# Patient Record
Sex: Male | Born: 1975 | Race: Black or African American | Hispanic: No | Marital: Single | Smoking: Never smoker
Health system: Southern US, Community
[De-identification: ages and names within clinical notes are randomized; demographics above are authoritative.]

---

## 1999-06-16 ENCOUNTER — Encounter: Admission: RE | Admit: 1999-06-16 | Discharge: 1999-09-14 | Payer: Self-pay | Admitting: *Deleted

## 2003-06-05 ENCOUNTER — Emergency Department (HOSPITAL_COMMUNITY): Admission: EM | Admit: 2003-06-05 | Discharge: 2003-06-05 | Payer: Self-pay | Admitting: Family Medicine

## 2006-04-28 ENCOUNTER — Emergency Department (HOSPITAL_COMMUNITY): Admission: EM | Admit: 2006-04-28 | Discharge: 2006-04-28 | Payer: Self-pay | Admitting: Emergency Medicine

## 2006-11-03 ENCOUNTER — Emergency Department (HOSPITAL_COMMUNITY): Admission: EM | Admit: 2006-11-03 | Discharge: 2006-11-03 | Payer: Self-pay | Admitting: Family Medicine

## 2008-05-26 ENCOUNTER — Emergency Department (HOSPITAL_COMMUNITY): Admission: EM | Admit: 2008-05-26 | Discharge: 2008-05-27 | Payer: Self-pay | Admitting: Emergency Medicine

## 2008-08-13 ENCOUNTER — Emergency Department (HOSPITAL_COMMUNITY): Admission: EM | Admit: 2008-08-13 | Discharge: 2008-08-13 | Payer: Self-pay | Admitting: Emergency Medicine

## 2009-11-16 ENCOUNTER — Emergency Department (HOSPITAL_COMMUNITY): Admission: EM | Admit: 2009-11-16 | Discharge: 2009-11-16 | Payer: Self-pay | Admitting: Emergency Medicine

## 2010-07-20 LAB — POCT I-STAT, CHEM 8
Calcium, Ion: 1.18 mmol/L (ref 1.12–1.32)
Creatinine, Ser: 1.1 mg/dL (ref 0.4–1.5)
Glucose, Bld: 95 mg/dL (ref 70–99)
HCT: 46 % (ref 39.0–52.0)
Hemoglobin: 15.6 g/dL (ref 13.0–17.0)
TCO2: 27 mmol/L (ref 0–100)

## 2010-07-20 LAB — DIFFERENTIAL
Basophils Absolute: 0 10*3/uL (ref 0.0–0.1)
Basophils Relative: 0 % (ref 0–1)
Lymphocytes Relative: 20 % (ref 12–46)
Neutro Abs: 6.6 10*3/uL (ref 1.7–7.7)
Neutrophils Relative %: 70 % (ref 43–77)

## 2010-07-20 LAB — CBC
Hemoglobin: 14.6 g/dL (ref 13.0–17.0)
MCHC: 33.3 g/dL (ref 30.0–36.0)
RBC: 5.46 MIL/uL (ref 4.22–5.81)
RDW: 13.4 % (ref 11.5–15.5)

## 2010-07-20 LAB — POCT CARDIAC MARKERS
CKMB, poc: <1 ng/mL — ABNORMAL LOW (ref 1.0–8.0)
Myoglobin, poc: 41.4 ng/mL (ref 12–200)

## 2010-07-27 LAB — COMPREHENSIVE METABOLIC PANEL
AST: 24 U/L (ref 0–37)
Albumin: 4.1 g/dL (ref 3.5–5.2)
Calcium: 9.3 mg/dL (ref 8.4–10.5)
Chloride: 107 mEq/L (ref 96–112)
Creatinine, Ser: 0.94 mg/dL (ref 0.4–1.5)
GFR calc Af Amer: >60 mL/min (ref >60)
Sodium: 141 mEq/L (ref 135–145)
Total Bilirubin: 0.7 mg/dL (ref 0.3–1.2)

## 2010-07-27 LAB — URINALYSIS, ROUTINE W REFLEX MICROSCOPIC
Glucose, UA: NEGATIVE mg/dL
Specific Gravity, Urine: 1.034 — ABNORMAL HIGH (ref 1.005–1.030)
Urobilinogen, UA: 0.2 mg/dL (ref 0.0–1.0)
pH: 6.5 (ref 5.0–8.0)

## 2010-07-27 LAB — URINE MICROSCOPIC-ADD ON

## 2010-07-27 LAB — DIFFERENTIAL
Eosinophils Relative: 2 % (ref 0–5)
Lymphocytes Relative: 23 % (ref 12–46)
Lymphs Abs: 2.3 10*3/uL (ref 0.7–4.0)
Monocytes Absolute: 0.8 10*3/uL (ref 0.1–1.0)

## 2010-07-27 LAB — CBC
MCV: 81.2 fL (ref 78.0–100.0)
Platelets: 269 10*3/uL (ref 150–400)
WBC: 10 10*3/uL (ref 4.0–10.5)

## 2010-07-27 LAB — POCT CARDIAC MARKERS: Troponin i, poc: <0.05 ng/mL (ref 0.00–0.09)

## 2011-12-19 ENCOUNTER — Encounter (HOSPITAL_COMMUNITY): Payer: Self-pay | Admitting: *Deleted

## 2011-12-19 DIAGNOSIS — Z87898 Personal history of other specified conditions: Secondary | ICD-10-CM | POA: Insufficient documentation

## 2011-12-19 DIAGNOSIS — M545 Low back pain, unspecified: Secondary | ICD-10-CM | POA: Insufficient documentation

## 2011-12-19 DIAGNOSIS — R1032 Left lower quadrant pain: Secondary | ICD-10-CM | POA: Insufficient documentation

## 2011-12-19 LAB — CBC WITH DIFFERENTIAL/PLATELET
Basophils Relative: 0 % (ref 0–1)
Hemoglobin: 15.1 g/dL (ref 13.0–17.0)
Lymphs Abs: 2.4 10*3/uL (ref 0.7–4.0)
Monocytes Relative: 9 % (ref 3–12)
Neutro Abs: 6.2 10*3/uL (ref 1.7–7.7)
Neutrophils Relative %: 64 % (ref 43–77)
RBC: 5.71 MIL/uL (ref 4.22–5.81)

## 2011-12-19 LAB — URINALYSIS, ROUTINE W REFLEX MICROSCOPIC
Leukocytes, UA: NEGATIVE
Nitrite: NEGATIVE
Specific Gravity, Urine: 1.024 (ref 1.005–1.030)
pH: 7 (ref 5.0–8.0)

## 2011-12-19 LAB — COMPREHENSIVE METABOLIC PANEL
Albumin: 4 g/dL (ref 3.5–5.2)
Alkaline Phosphatase: 54 U/L (ref 39–117)
BUN: 16 mg/dL (ref 6–23)
Chloride: 101 mEq/L (ref 96–112)
Glucose, Bld: 90 mg/dL (ref 70–99)
Potassium: 3.9 mEq/L (ref 3.5–5.1)
Total Bilirubin: 0.4 mg/dL (ref 0.3–1.2)

## 2011-12-19 NOTE — ED Notes (Signed)
C/o lt flank pain for 2 hours no previous history.  Nausea vomited once..  He has taken a ibu.

## 2011-12-20 ENCOUNTER — Emergency Department (HOSPITAL_COMMUNITY): Payer: Self-pay

## 2011-12-20 ENCOUNTER — Emergency Department (HOSPITAL_COMMUNITY)
Admission: EM | Admit: 2011-12-20 | Discharge: 2011-12-20 | Disposition: A | Discharge status: Home / Self Care | Payer: Self-pay | Attending: Emergency Medicine | Admitting: Emergency Medicine

## 2011-12-20 ENCOUNTER — Encounter (HOSPITAL_COMMUNITY): Payer: Self-pay | Admitting: Radiology

## 2011-12-20 DIAGNOSIS — R1032 Left lower quadrant pain: Secondary | ICD-10-CM

## 2011-12-20 MED ORDER — MELOXICAM 7.5 MG PO TABS: 15.0000 mg | ORAL_TABLET | Freq: Every day | ORAL | Status: DC

## 2011-12-20 MED ORDER — HYDROMORPHONE HCL PF 1 MG/ML IJ SOLN
1.0000 mg | Freq: Once | INTRAMUSCULAR | Status: AC
  Administered 2011-12-20: 1 mg via INTRAVENOUS
  Filled 2011-12-20: qty 1

## 2011-12-20 MED ORDER — ONDANSETRON HCL 4 MG/2ML IJ SOLN
4.0000 mg | Freq: Once | INTRAMUSCULAR | Status: AC
  Administered 2011-12-20: 4 mg via INTRAVENOUS
  Filled 2011-12-20: qty 2

## 2011-12-20 MED ORDER — SODIUM CHLORIDE 0.9 % IV SOLN
Freq: Once | INTRAVENOUS | Status: AC
  Administered 2011-12-20: 02:00:00 via INTRAVENOUS

## 2011-12-20 MED ORDER — IOHEXOL 300 MG/ML  SOLN: 20.0000 mL | INTRAMUSCULAR | Status: AC

## 2011-12-20 MED ORDER — IOHEXOL 300 MG/ML  SOLN
100.0000 mL | Freq: Once | INTRAMUSCULAR | Status: AC | PRN
  Administered 2011-12-20: 100 mL via INTRAVENOUS

## 2011-12-20 NOTE — ED Provider Notes (Signed)
Medical screening examination/treatment/procedure(s) were performed by non-physician practitioner and as supervising physician I was immediately available for consultation/collaboration.   Navdeep Fessenden, MD 12/20/11 2255 

## 2011-12-20 NOTE — ED Provider Notes (Signed)
History     CSN: [id number]  Arrival date & time 12/19/11  4098   First MD Initiated Contact with Patient 12/20/11 0033      Chief Complaint  Patient presents with  . Back Pain    (Consider location/radiation/quality/duration/timing/severity/associated sxs/prior treatment) HPI Comments: Patient complaining of left side.  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  Her for syndrome has never had a bowel obstruction or an ileus.  He, states his pain started about 5:00 yesterday, afternoon, it's been constant, associated with nausea.  He does not report any diarrhea, or vomiting.  He states he is not passing gas.  He is had no appetite, although he did force himself to eat a small amount at dinner, which did not cause any increase in his pain, nausea or vomiting  Patient is a 36 y.o. male presenting with back pain. The history is provided by the patient.  Back Pain  This is a new problem. The problem occurs constantly. The problem has not changed since onset.The pain is associated with no known injury. The quality of the pain is described as aching. Pertinent negatives include no fever, no headaches, no abdominal pain, no dysuria and no weakness.    History reviewed. No pertinent past medical history.  History reviewed. No pertinent past surgical history.  No family history on file.  History  Substance Use Topics  . Smoking status: Never Smoker   . Smokeless tobacco: Not on file  . Alcohol Use: No      Review of Systems  Constitutional: Negative for fever and chills.  HENT: Negative for congestion.   Respiratory: Negative for cough and shortness of breath.   Gastrointestinal: Positive for nausea and vomiting. Negative for abdominal pain, diarrhea and constipation.  Genitourinary: Positive for flank pain. Negative for dysuria, hematuria and decreased urine volume.  Musculoskeletal: Positive for back pain.    Neurological: Negative for dizziness, weakness and headaches.    Allergies  Review of patient's allergies indicates no known allergies.  Home Medications   Current Outpatient Rx  Name Route Sig Dispense Refill  . IBUPROFEN 800 MG PO TABS Oral Take 800 mg by mouth once.    . MELOXICAM 7.5 MG PO TABS Oral Take 2 tablets (15 mg total) by mouth daily. 20 tablet 0    BP 120/75  Pulse 60  Temp 98.1 F (36.7 C) (Oral)  Resp 20  SpO2 100%  Physical Exam  Constitutional: He appears well-developed and well-nourished.  HENT:  Head: Normocephalic.  Eyes: Pupils are equal, round, and reactive to light.  Neck: Normal range of motion.  Cardiovascular: Normal rate.   Pulmonary/Chest: Effort normal.  Abdominal: Soft. He exhibits no distension. Bowel sounds are decreased. There is no tenderness.      ED Course  Procedures (including critical care time)   Labs Reviewed  URINALYSIS, ROUTINE W REFLEX MICROSCOPIC  CBC WITH DIFFERENTIAL  COMPREHENSIVE METABOLIC PANEL  LAB REPORT - SCANNED   No results found.   1. Abdominal pain, LLQ       MDM          Arman Filter, NP 12/23/11 [phone]

## 2011-12-20 NOTE — ED Provider Notes (Signed)
  Physical Exam  BP 129/83  Pulse 60  Temp 98.7 F (37.1 C) (Oral)  Resp 18  SpO2 100%  6:19 AM Assumed care of the patient from Philis Kendall. Patient with distant history of Hirschsprung's disease, colostomy, and repair, complaint of intermittent left lower quadrant pain. Plain films of the abdomen showed no obstruction. Labs have been within normal limits. Awaiting. CT results of the abdomen. Physical Exam 9:12 AM BP 129/83  Pulse 60  Temp 98.7 F (37.1 C) (Oral)  Resp 18  SpO2 100% CV: RRR, No M/R/G, Peripheral pulses intact. No peripheral edema. Lungs: CTAB Abd: Soft, non distended. Tenderness to palpation of the iliopsoas muscle on the left side.   Pain is reproduced when pressure is applied to the psoas muscle and active hip flexion is initiated. I believe the patient's pain is likely related to spasm in this tissue and would account for both his left lower quadrant and low back pain on the left. Patient is also under extreme stress. He is undergoing relationship breakup breakup and has assumed care of his 37-year-old. I believe he is also suffering from depression and anxiety. I am treating the patient with meloxicam for muscle spasm. I have also suggested counseling. Return precautions discussed. All questions answered fully.  ED Course  Procedures  MDM Discussed reasons to seek immediate care. Patient expresses understanding and agrees with plan.'       Arthor Captain, PA-C 12/20/11 1610

## 2011-12-24 NOTE — ED Provider Notes (Signed)
Medical screening examination/treatment/procedure(s) were performed by non-physician practitioner and as supervising physician I was immediately available for consultation/collaboration.  Treyson Axel, MD 12/24/11 0041 

## 2012-05-22 ENCOUNTER — Emergency Department (HOSPITAL_COMMUNITY)
Admission: EM | Admit: 2012-05-22 | Discharge: 2012-05-22 | Disposition: A | Discharge status: Home / Self Care | Payer: Self-pay | Attending: Emergency Medicine | Admitting: Emergency Medicine

## 2012-05-22 ENCOUNTER — Emergency Department (HOSPITAL_COMMUNITY): Payer: Self-pay

## 2012-05-22 ENCOUNTER — Encounter (HOSPITAL_COMMUNITY): Payer: Self-pay | Admitting: Physical Medicine and Rehabilitation

## 2012-05-22 DIAGNOSIS — F439 Reaction to severe stress, unspecified: Secondary | ICD-10-CM

## 2012-05-22 DIAGNOSIS — Z79899 Other long term (current) drug therapy: Secondary | ICD-10-CM | POA: Insufficient documentation

## 2012-05-22 DIAGNOSIS — R079 Chest pain, unspecified: Secondary | ICD-10-CM

## 2012-05-22 DIAGNOSIS — F430 Acute stress reaction: Secondary | ICD-10-CM | POA: Insufficient documentation

## 2012-05-22 DIAGNOSIS — R0602 Shortness of breath: Secondary | ICD-10-CM | POA: Insufficient documentation

## 2012-05-22 LAB — BASIC METABOLIC PANEL WITH GFR
BUN: 13 mg/dL (ref 6–23)
CO2: 24 meq/L (ref 19–32)
Calcium: 9.2 mg/dL (ref 8.4–10.5)
Chloride: 104 meq/L (ref 96–112)
Creatinine, Ser: 0.81 mg/dL (ref 0.50–1.35)
GFR calc Af Amer: >90 mL/min
GFR calc non Af Amer: >90 mL/min
Glucose, Bld: 94 mg/dL (ref 70–99)
Potassium: 4.2 meq/L (ref 3.5–5.1)
Sodium: 138 meq/L (ref 135–145)

## 2012-05-22 LAB — CBC WITH DIFFERENTIAL/PLATELET
Basophils Relative: 0 % (ref 0–1)
Eosinophils Absolute: 0.1 10*3/uL (ref 0.0–0.7)
Eosinophils Relative: 1 % (ref 0–5)
HCT: 45.6 % (ref 39.0–52.0)
Hemoglobin: 15 g/dL (ref 13.0–17.0)
Lymphs Abs: 2.3 10*3/uL (ref 0.7–4.0)
MCH: 26.2 pg (ref 26.0–34.0)
MCHC: 32.9 g/dL (ref 30.0–36.0)
MCV: 79.7 fL (ref 78.0–100.0)
Monocytes Absolute: 0.7 10*3/uL (ref 0.1–1.0)
Monocytes Relative: 9 % (ref 3–12)
RBC: 5.72 MIL/uL (ref 4.22–5.81)

## 2012-05-22 LAB — POCT I-STAT TROPONIN I: Troponin i, poc: 0 ng/mL (ref 0.00–0.08)

## 2012-05-22 LAB — TROPONIN I: Troponin I: <0.3 ng/mL (ref <0.30)

## 2012-05-22 NOTE — ED Provider Notes (Signed)
History     CSN: [id number]  Arrival date & time 05/22/12  1413   First MD Initiated Contact with Patient 05/22/12 1522      Chief Complaint  Patient presents with  . Chest Pain  . Shortness of Breath    (Consider location/radiation/quality/duration/timing/severity/associated sxs/prior treatment) HPI Comments: 37 year old male with no significant past medical history presents emergency department complaining of left-sided chest pain with onset 4 hours ago lasting four minutes this afternoon while at work. Describes the pain as a nonradiating "bubble". Admits to associated shortness of breath at that time. Currently he does not have any chest pain. States when the pain came on, he sat down and took a deep breath which helped the pain go away. Admits to being under a lot of increased stress in his daily life due to issues in his relationship. Denies nausea, vomiting, diaphoresis or any other symptoms. States he's had similar chest pain in the past on and off for the past 10 years, however no specific diagnosis has been found. He is a nonsmoker. No family history of early heart disease. No recent long car or plane rides.  Patient is a 37 y.o. male presenting with chest pain and shortness of breath. The history is provided by the patient.  Chest Pain Associated symptoms: shortness of breath   Associated symptoms: no back pain, no cough, no diaphoresis, no nausea, no numbness, no palpitations and not vomiting   Shortness of Breath Associated symptoms: chest pain   Associated symptoms: no cough, no diaphoresis, no neck pain, no vomiting and no wheezing     No past medical history on file.  No past surgical history on file.  History reviewed. No pertinent family history.  History  Substance Use Topics  . Smoking status: Never Smoker   . Smokeless tobacco: Not on file  . Alcohol Use: No      Review of Systems  Constitutional: Negative for diaphoresis.  HENT: Negative for neck pain  and neck stiffness.   Respiratory: Positive for shortness of breath. Negative for cough and wheezing.   Cardiovascular: Positive for chest pain. Negative for palpitations and leg swelling.  Gastrointestinal: Negative for nausea and vomiting.  Musculoskeletal: Negative for back pain.  Neurological: Negative for syncope and numbness.  Psychiatric/Behavioral: Negative for confusion.       Positive for increased stress.  All other systems reviewed and are negative.    Allergies  Review of patient's allergies indicates no known allergies.  Home Medications   Current Outpatient Rx  Name  Route  Sig  Dispense  Refill  . ibuprofen (ADVIL,MOTRIN) 800 MG tablet   Oral   Take 800 mg by mouth once.         . meloxicam (MOBIC) 7.5 MG tablet   Oral   Take 2 tablets (15 mg total) by mouth daily.   20 tablet   0     BP 138/76  Pulse 74  Temp(Src) 98.2 F (36.8 C) (Oral)  Resp 16  SpO2 98%  Physical Exam  Nursing note and vitals reviewed. Constitutional: He is oriented to person, place, and time. He appears well-developed and well-nourished. No distress.  HENT:  Head: Normocephalic and atraumatic.  Mouth/Throat: Oropharynx is clear and moist.  Eyes: Conjunctivae and EOM are normal. Pupils are equal, round, and reactive to light.  Neck: Normal range of motion. Neck supple. No JVD present.  Cardiovascular: Normal rate, regular rhythm, normal heart sounds and intact distal pulses.   No extremity  edema.  Pulmonary/Chest: Effort normal and breath sounds normal. No respiratory distress.  Abdominal: Soft. Bowel sounds are normal. There is no tenderness.  Musculoskeletal: Normal range of motion. He exhibits no edema.  Neurological: He is alert and oriented to person, place, and time.  Skin: Skin is warm and dry.  Psychiatric: He has a normal mood and affect. His behavior is normal.    ED Course  Procedures (including critical care time)  Labs Reviewed  CBC WITH DIFFERENTIAL   BASIC METABOLIC PANEL  TROPONIN I  POCT I-STAT TROPONIN I   Date: 05/22/2012  Rate: 66  Rhythm: normal sinus rhythm and sinus arrhythmia  QRS Axis: normal  Intervals: normal  ST/T Wave abnormalities: nonspecific ST changes  Conduction Disutrbances:none  Narrative Interpretation: early repolarization  Old EKG Reviewed: none available   Dg Chest 2 View  05/22/2012  *RADIOLOGY REPORT*  Clinical Data: Chest pain and shortness of breath.  CHEST - 2 VIEW  Comparison: Two-view chest 08/13/2008.  Findings: Heart size is normal.  The lungs are clear.  The visualized soft tissues and bony thorax are unremarkable.  IMPRESSION: Negative two-view chest.   Original Report Authenticated By: Marin Roberts, M.D.      1. Stress   2. Chest pain       MDM  37 year old male with chest pain lasting about 4 minutes 4 hours prior to arrival. Currently he is in no apparent distress. Admits to being under a lot of increased stress in life and with his relationship. All of his labs, EKG and chest x-ray were normal. Initial troponin negative. Three-hour troponin obtained which was also negative. He is stable for discharge. He will followup with a primary care physician on the resource list. Case discussed with Dr. Silverio Lay who agrees with plan of care. Return precautions discussed. Patient states understanding of plan and is agreeable.        Trevor Mace, PA-C 05/22/12 1925

## 2012-05-22 NOTE — ED Notes (Signed)
Pt presents to department for evaluation of midsternal non radiating chest pain. Onset this afternoon while at work. Pt states he has been dealing with same issue x10 years. Also states SOB. Denies chest pain at the time. States he has been under severe stress lately. He is alert and oriented x4. Skin warm and dry. Respirations unlabored.

## 2012-05-22 NOTE — ED Notes (Addendum)
Pt presents for evaluation of intermittent chest pain that he has had over the past 2 years, denies any radiation of pain.  Pt was at work and states "it felt like an air bubble went through my heart and I was concerned", also states he was told in the emergency department that he had inflammation in the lining of his heart.  Denies seeing a cardiologist for symptoms.  Pt resting and denies any pain at present.  Pt states he has been under a lot of stress lately.

## 2012-05-23 NOTE — ED Provider Notes (Signed)
Medical screening examination/treatment/procedure(s) were performed by non-physician practitioner and as supervising physician I was immediately available for consultation/collaboration.   Richardean Canal, MD 05/23/12 2139

## 2016-02-08 ENCOUNTER — Emergency Department (HOSPITAL_COMMUNITY): Payer: Medicaid Other

## 2016-02-08 ENCOUNTER — Emergency Department (HOSPITAL_COMMUNITY)
Admission: EM | Admit: 2016-02-08 | Discharge: 2016-02-08 | Disposition: A | Discharge status: Home / Self Care | Payer: Medicaid Other | Attending: Emergency Medicine | Admitting: Emergency Medicine

## 2016-02-08 DIAGNOSIS — R0789 Other chest pain: Secondary | ICD-10-CM | POA: Insufficient documentation

## 2016-02-08 DIAGNOSIS — Z7982 Long term (current) use of aspirin: Secondary | ICD-10-CM | POA: Insufficient documentation

## 2016-02-08 DIAGNOSIS — Z79899 Other long term (current) drug therapy: Secondary | ICD-10-CM | POA: Insufficient documentation

## 2016-02-08 DIAGNOSIS — R079 Chest pain, unspecified: Secondary | ICD-10-CM | POA: Diagnosis present

## 2016-02-08 LAB — COMPREHENSIVE METABOLIC PANEL
ALK PHOS: 51 U/L (ref 38–126)
ALT: 31 U/L (ref 17–63)
ANION GAP: 6 (ref 5–15)
AST: 26 U/L (ref 15–41)
Albumin: 4 g/dL (ref 3.5–5.0)
BUN: 11 mg/dL (ref 6–20)
CALCIUM: 9 mg/dL (ref 8.9–10.3)
CO2: 24 mmol/L (ref 22–32)
Chloride: 108 mmol/L (ref 101–111)
Creatinine, Ser: 0.85 mg/dL (ref 0.61–1.24)
Glucose, Bld: 91 mg/dL (ref 65–99)
Potassium: 4.4 mmol/L (ref 3.5–5.1)
SODIUM: 138 mmol/L (ref 135–145)
TOTAL PROTEIN: 7.2 g/dL (ref 6.5–8.1)
Total Bilirubin: 0.5 mg/dL (ref 0.3–1.2)

## 2016-02-08 LAB — CBC
HCT: 46.1 % (ref 39.0–52.0)
HEMOGLOBIN: 15.3 g/dL (ref 13.0–17.0)
MCH: 26.5 pg (ref 26.0–34.0)
MCHC: 33.2 g/dL (ref 30.0–36.0)
MCV: 79.9 fL (ref 78.0–100.0)
Platelets: 249 10*3/uL (ref 150–400)
RBC: 5.77 MIL/uL (ref 4.22–5.81)
RDW: 13 % (ref 11.5–15.5)
WBC: 9.8 10*3/uL (ref 4.0–10.5)

## 2016-02-08 LAB — TROPONIN I

## 2016-02-08 LAB — PROTIME-INR
INR: 0.96
PROTHROMBIN TIME: 12.8 s (ref 11.4–15.2)

## 2016-02-08 NOTE — ED Provider Notes (Signed)
[facility]-EMERGENCY DEPT Provider Note   CSN: [id number] Arrival date & time: 02/08/16  1348     History   Chief Complaint Chief Complaint  Patient presents with  . Chest Pain    HPI Keith Cook is a 40 y.o. male.  Pt presents to the ED today with CP.  Pt said that he has a hx of left sided chest pain due to "inflammation."  The pt said that he controlled his sx with nutritional supplements, but has not been taking them lately.  He said that he's not been eating a healthy diet either.  The pt said he ate an entire pie last night.  Pt said he took an asa by EMS and that has helped his sx.      No past medical history on file.  There are no active problems to display for this patient.   No past surgical history on file.     Home Medications    Prior to Admission medications   Medication Sig Start Date End Date Taking? Authorizing Provider  aspirin 81 MG chewable tablet Chew 324 mg by mouth daily as needed for mild pain.   Yes Historical Provider, MD  Misc Natural Products (DETOX PO) Take 1 tablet by mouth daily as needed (fluid).   Yes Historical Provider, MD    Family History No family history on file.  Social History Social History  Substance Use Topics  . Smoking status: Never Smoker  . Smokeless tobacco: Not on file  . Alcohol use No     Allergies   Review of patient's allergies indicates no known allergies.   Review of Systems Review of Systems  Cardiovascular: Positive for chest pain.  All other systems reviewed and are negative.    Physical Exam Updated Vital Signs BP 132/91   Pulse (!) 56   Temp 98.8 F (37.1 C) (Oral)   Resp 15   Ht 6\' 1"  (1.854 m)   Wt 185 lb (83.9 kg)   SpO2 93%   BMI 24.41 kg/m   Physical Exam  Constitutional: He is oriented to person, place, and time. He appears well-developed and well-nourished.  HENT:  Head: Normocephalic and atraumatic.  Right Ear: External ear normal.  Left Ear: External ear normal.    Nose: Nose normal.  Mouth/Throat: Oropharynx is clear and moist.  Eyes: Conjunctivae and EOM are normal. Pupils are equal, round, and reactive to light.  Neck: Normal range of motion. Neck supple.  Cardiovascular: Regular rhythm, normal heart sounds and intact distal pulses.  Bradycardia present.   Pulmonary/Chest: Effort normal and breath sounds normal.  Abdominal: Soft. Bowel sounds are normal.  Musculoskeletal: Normal range of motion.  Neurological: He is alert and oriented to person, place, and time.  Skin: Skin is warm.  Psychiatric: He has a normal mood and affect. His behavior is normal. Judgment and thought content normal.  Nursing note and vitals reviewed.    ED Treatments / Results  Labs (all labs ordered are listed, but only abnormal results are displayed) Labs Reviewed  CBC  COMPREHENSIVE METABOLIC PANEL  PROTIME-INR  TROPONIN I  URINALYSIS, ROUTINE W REFLEX MICROSCOPIC (NOT [facility])    EKG  EKG Interpretation  Date/Time:  Monday February 08 2016 14:09:32 EDT Ventricular Rate:  54 PR Interval:    QRS Duration: 86 QT Interval:  385 QTC Calculation: 365 R Axis:   58 Text Interpretation:  Sinus rhythm Consider left ventricular hypertrophy ST elev, probable normal early repol pattern No  significant change since last tracing Confirmed by [facility] MD, Keymarion Bearman [phone]([id number]) on 02/08/2016 3:09:57 PM       Radiology Dg Chest 2 View  Result Date: 02/08/2016 CLINICAL DATA:  Acute onset chest pain today for approximately 1 hour. EXAM: CHEST  2 VIEW COMPARISON:  05/22/2012 FINDINGS: The heart size and mediastinal contours are within normal limits. Both lungs are clear. No evidence of pleural effusion or pneumothorax. The visualized skeletal structures are unremarkable. IMPRESSION: Negative.  No active cardiopulmonary disease. Electronically Signed   By: Myles RosenthalJohn  Stahl M.D.   On: 02/08/2016 15:21    Procedures Procedures (including critical care time)  Medications Ordered in  ED Medications - No data to display   Initial Impression / Assessment and Plan / ED Course  I have reviewed the triage vital signs and the nursing notes.  Pertinent labs & imaging results that were available during my care of the patient were reviewed by me and considered in my medical decision making (see chart for details).  Clinical Course   Pt looks good.  Low risk for CAD.  Nl trop and CXR.  Pt knows to try to eat healthier and to exercise.  He knows to return if worse.    Final Clinical Impressions(s) / ED Diagnoses   Final diagnoses:  Atypical chest pain    New Prescriptions New Prescriptions   No medications on file     Jacalyn LefevreJulie Vernel Langenderfer, MD 02/08/16 1600

## 2016-02-08 NOTE — ED Triage Notes (Signed)
[facility] with [facility]/o chest pain that lasted 45 mins to an hour. 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 Patient ate a whole pie and states he hasn't been taking all his usually nutritional supplements. Patient states the sugary foods usually bring on the chest pain. Patient received 324 mg aspirin en route. EMS IV access obtained, 20 in left hand. EMS v/s 133/97, 98%, 70 HR, 18 RR. No c/o chest pain currently.

## 2017-06-30 IMAGING — CR DG CHEST 2V
2 series · 2 of 2 positions shown · non-contrast
Comparison: 05/22/2012

CLINICAL DATA: Acute onset chest pain today for approximately 1
hour.

EXAM:
CHEST  2 VIEW

[chest pa]
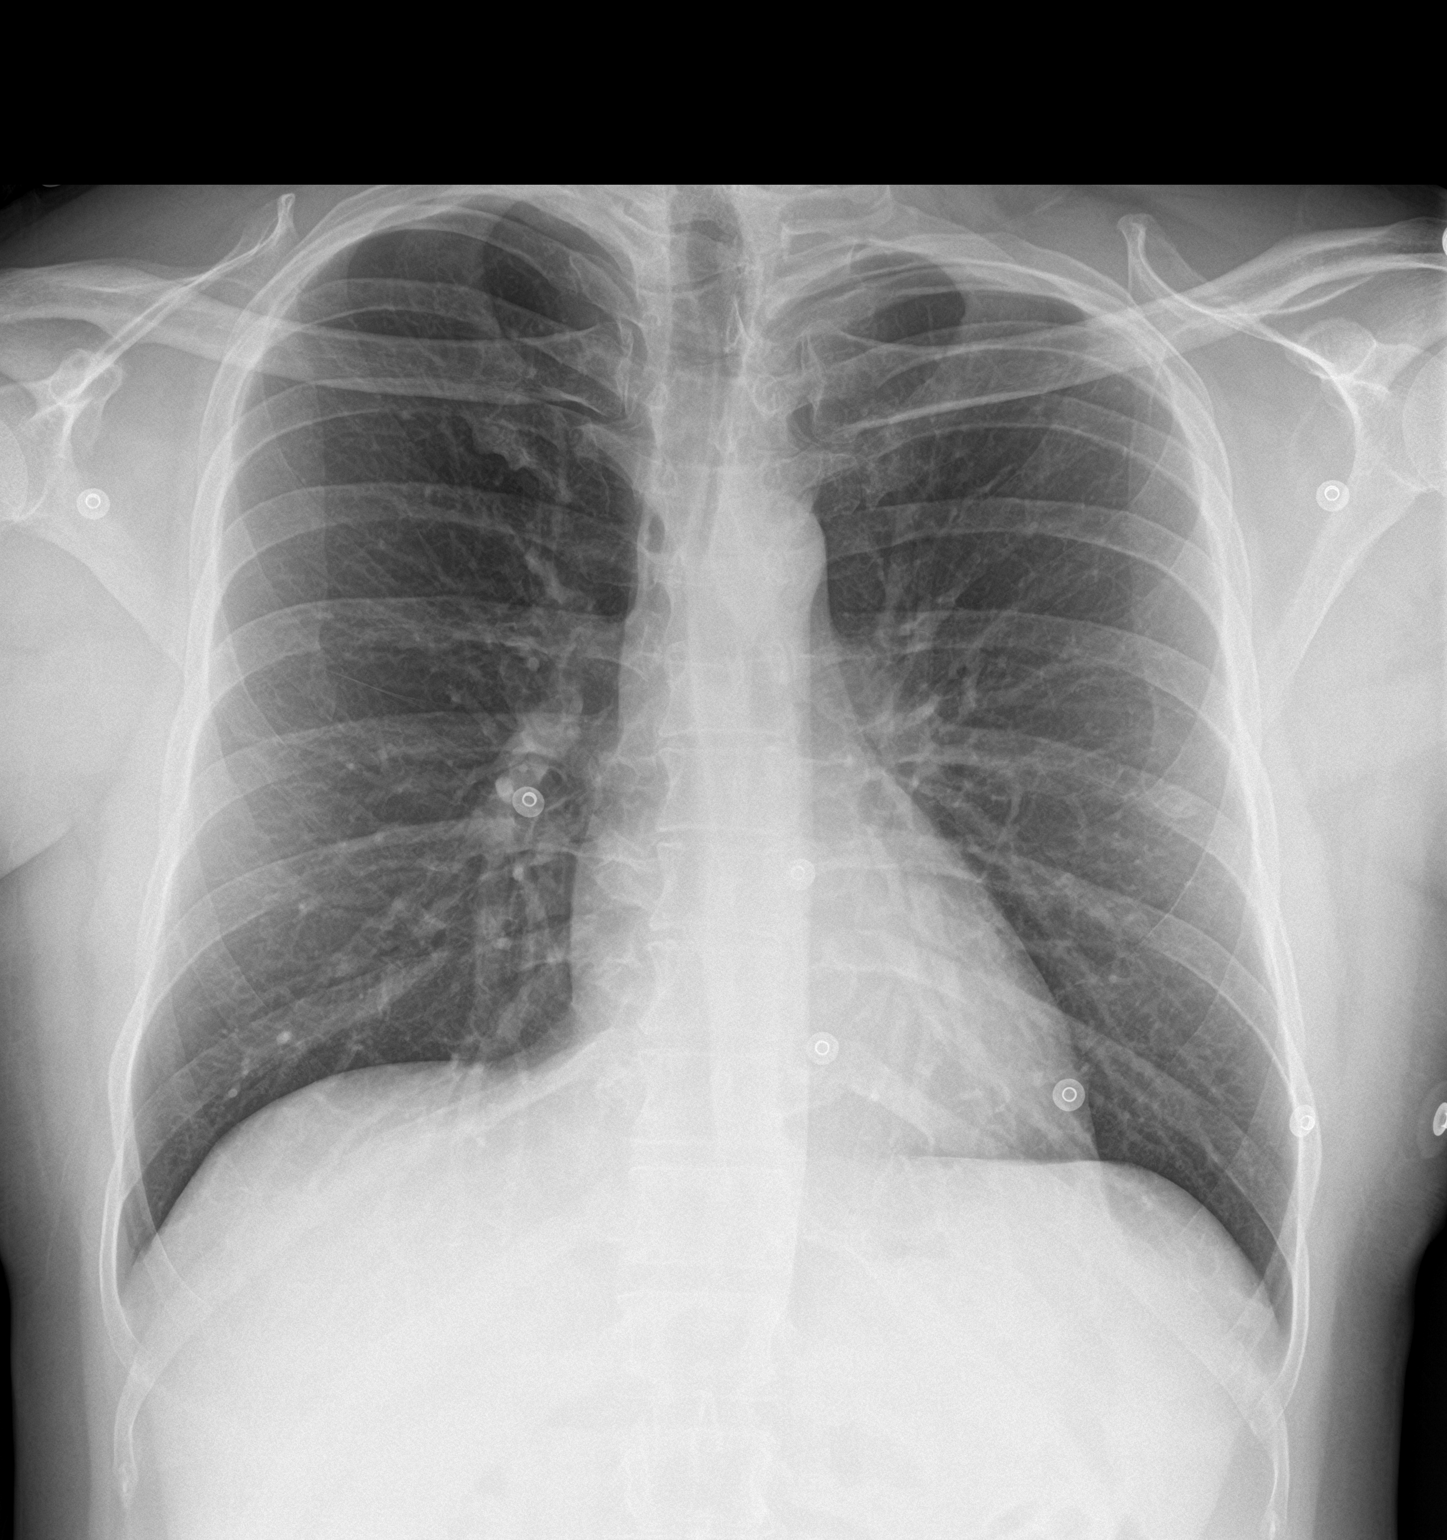

[chest lat]
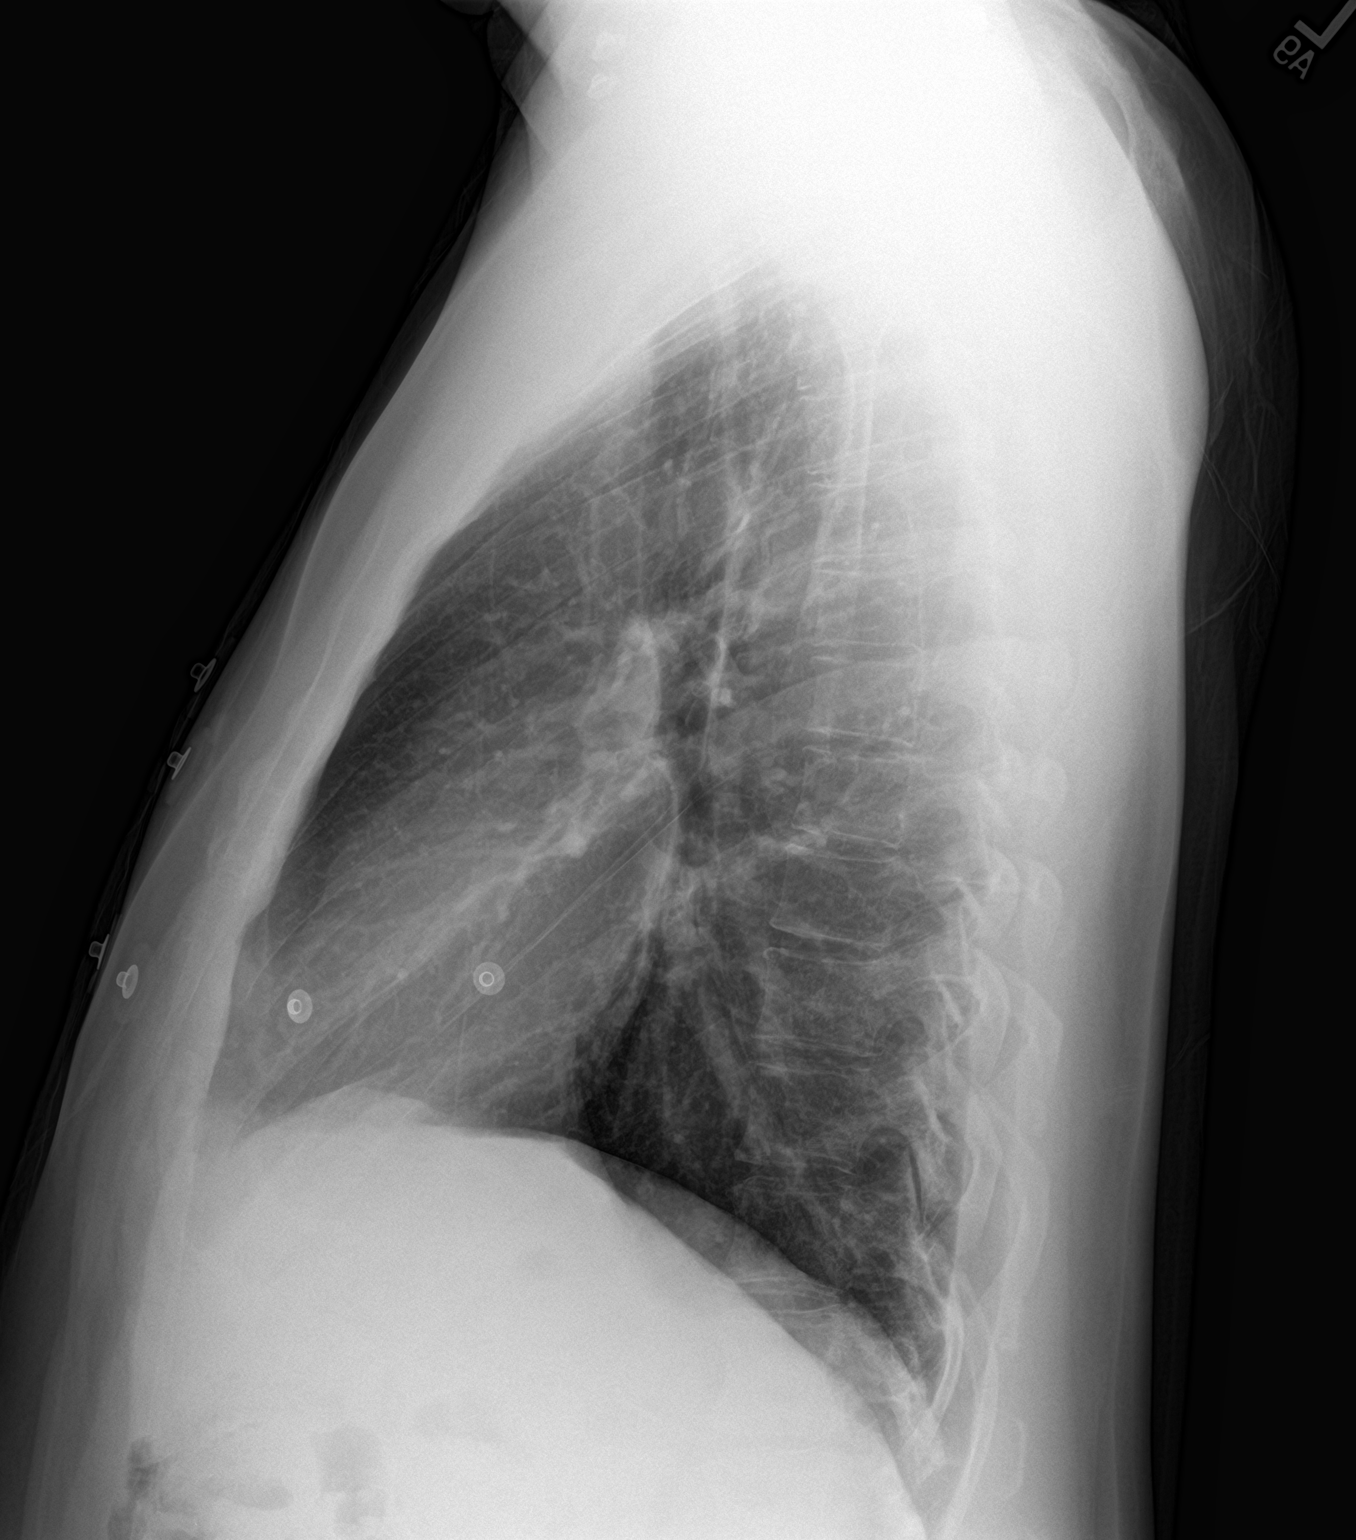

[2 of 2 positions shown; findings below may reference images not displayed]

FINDINGS: The heart size and mediastinal contours are within normal limits.
Both lungs are clear. No evidence of pleural effusion or
pneumothorax. The visualized skeletal structures are unremarkable.
IMPRESSION: Negative.  No active cardiopulmonary disease.

## 2022-07-26 ENCOUNTER — Other Ambulatory Visit: Payer: Self-pay

## 2022-07-26 ENCOUNTER — Encounter (HOSPITAL_COMMUNITY): Payer: Self-pay | Admitting: Emergency Medicine

## 2022-07-26 ENCOUNTER — Emergency Department (HOSPITAL_COMMUNITY)
Admission: EM | Admit: 2022-07-26 | Discharge: 2022-07-26 | Disposition: A | Discharge status: Home / Self Care | Payer: Medicaid Other | Attending: Emergency Medicine | Admitting: Emergency Medicine

## 2022-07-26 ENCOUNTER — Emergency Department (HOSPITAL_COMMUNITY): Payer: Medicaid Other

## 2022-07-26 DIAGNOSIS — M79642 Pain in left hand: Secondary | ICD-10-CM | POA: Diagnosis present

## 2022-07-26 DIAGNOSIS — Z7982 Long term (current) use of aspirin: Secondary | ICD-10-CM | POA: Insufficient documentation

## 2022-07-26 MED ORDER — IBUPROFEN 800 MG PO TABS
800.0000 mg | ORAL_TABLET | Freq: Once | ORAL | Status: AC
  Administered 2022-07-26: 800 mg via ORAL
  Filled 2022-07-26: qty 1

## 2022-07-26 NOTE — Discharge Instructions (Signed)
Return to ED with any new symptom such as fevers, overlying skin change to left thumb Please follow-up with your PCP for further management Please remain in resplint till seen by PCP. Please take ibuprofen or Tylenol for pain in her hand, you may also ice your left thumb Please read attached concerning hand pain

## 2022-07-26 NOTE — ED Triage Notes (Signed)
Arrives EMS from home with left hand pain / swelling noticed ~noon today. Limited movement with fingers r/t pain. Suspected spider bite per pt. No wound or abnormality visualized.

## 2022-07-26 NOTE — ED Provider Notes (Signed)
EMERGENCY DEPARTMENT AT [facility] Provider Note   CSN: [id number] Arrival date & time: 07/26/22  2029     History  Chief Complaint  Patient presents with   Hand Pain    Keith Cook is a 47 y.o. male with no documented medical history.  Patient presents to ED for evaluation of left thumb injury/pain.  Patient reports he woke up this morning with pain in his left thumb.  Patient denies trauma to his left thumb, reports he might of seen a spider however unsure.  On exam patient has no signs of infection.  Patient denies medications prior to arrival.  Patient reports pain got so bad he took an ambulance here.  Denies fevers, nausea vomiting.  Patient denies numbness or tingling into his left thumb.  Denies overlying skin change, redness.   Hand Pain       Home Medications Prior to Admission medications   Medication Sig Start Date End Date Taking? Authorizing Provider  aspirin 81 MG chewable tablet Chew 324 mg by mouth daily as needed for mild pain.    [provider]  Misc Natural Products (DETOX PO) Take 1 tablet by mouth daily as needed (fluid).    [provider]      Allergies    Patient has no known allergies.    Review of Systems   Review of Systems  Musculoskeletal:  Positive for arthralgias.  All other systems reviewed and are negative.   Physical Exam Updated Vital Signs BP (!) 143/102 (BP Location: Right Arm)   Pulse 67   Temp 98.4 F (36.9 C) (Oral)   Resp 20   Ht  (1.854 m)   Wt 79.4 kg   SpO2 96%   BMI 23.09 kg/m  Physical Exam Vitals and nursing note reviewed.  Constitutional:      General: He is not in acute distress.    Appearance: He is well-developed.  HENT:     Head: Normocephalic and atraumatic.  Eyes:     Conjunctiva/sclera: Conjunctivae normal.  Cardiovascular:     Rate and Rhythm: Normal rate and regular rhythm.     Heart sounds: No murmur heard. Pulmonary:     Effort: Pulmonary  effort is normal. No respiratory distress.     Breath sounds: Normal breath sounds.  Abdominal:     Palpations: Abdomen is soft.     Tenderness: There is no abdominal tenderness.  Musculoskeletal:        General: No swelling.     Cervical back: Neck supple.     Comments: Left thumb with reduced range of motion secondary to pain.  Slight swelling to thenar eminence however no overlying skin change.  2+ radial pulse.  Brisk capillary refill.  Neurovascularly intact.  Skin:    General: Skin is warm and dry.     Capillary Refill: Capillary refill takes less than 2 seconds.  Neurological:     Mental Status: He is alert.  Psychiatric:        Mood and Affect: Mood normal.     ED Results / Procedures / Treatments   Labs (all labs ordered are listed, but only abnormal results are displayed) Labs Reviewed - No data to display  EKG None  Radiology DG Hand Complete Left  Result Date: 07/26/2022 CLINICAL DATA:  Left hand pain and swelling EXAM: LEFT HAND - COMPLETE 3+ VIEW COMPARISON:  None Available. FINDINGS: No fracture or acute bony findings. No foreign body or gas  in the soft tissues. IMPRESSION: 1. No significant abnormality identified. Electronically Signed   By: Gaylyn Rong M.D.   On: 07/26/2022 21:22    Procedures Procedures    Medications Ordered in ED Medications  ibuprofen (ADVIL) tablet 800 mg (has no administration in time range)    ED Course/ Medical Decision Making/ A&P                             Medical Decision Making Amount and/or Complexity of Data Reviewed Radiology: ordered.  Risk Prescription drug management.   47 year old male presents to ED for evaluation.  Please see HPI for further details.  On examination the patient is afebrile and nontachycardic.  Patient does have slight soft tissue swelling to thenar eminence of left hand however no overlying skin change.  Range of motion is reduced secondary to pain.  Patient neurovascularly intact.   2+ radial pulse, brisk cap refill.  No sign of flexor tenosynovitis.  No snuffbox tenderness.  Patient case discussed with attending Dr. Theresia Lo.  Dr. Theresia Lo assessed the patient.  We are unsure of the patient's cause of pain so we will place the patient in a wrist splint and have him follow-up with his PCP.  Patient given return precautions to include fevers, redness or swelling, overlying skin change patient left thumb.  Patient voiced understanding.  Patient had all of his questions answered to his satisfaction.  Patient stable to discharge home.  Final Clinical Impression(s) / ED Diagnoses Final diagnoses:  Left hand pain    Rx / DC Orders ED Discharge Orders     None         Al Decant, PA-C 07/26/22 2245    Elayne Snare K, DO 07/26/22 2358
